# Patient Record
Sex: Female | Born: 1965 | Race: White | Hispanic: No | Marital: Single | Smoking: Current every day smoker
Health system: Southern US, Community
[De-identification: ages and names within clinical notes are randomized; demographics above are authoritative.]

## PROBLEM LIST (undated history)

## (undated) DIAGNOSIS — C801 Malignant (primary) neoplasm, unspecified: Secondary | ICD-10-CM

## (undated) DIAGNOSIS — F32A Depression, unspecified: Secondary | ICD-10-CM

## (undated) DIAGNOSIS — F329 Major depressive disorder, single episode, unspecified: Secondary | ICD-10-CM

## (undated) DIAGNOSIS — F419 Anxiety disorder, unspecified: Secondary | ICD-10-CM

## (undated) HISTORY — PX: TUBAL LIGATION: SHX77

## (undated) HISTORY — PX: CHOLECYSTECTOMY: SHX55

---

## 2007-10-14 DIAGNOSIS — C801 Malignant (primary) neoplasm, unspecified: Secondary | ICD-10-CM

## 2007-10-14 HISTORY — DX: Malignant (primary) neoplasm, unspecified: C80.1

## 2016-09-12 ENCOUNTER — Encounter (HOSPITAL_COMMUNITY): Payer: Self-pay | Admitting: *Deleted

## 2016-09-12 DIAGNOSIS — Z8541 Personal history of malignant neoplasm of cervix uteri: Secondary | ICD-10-CM | POA: Insufficient documentation

## 2016-09-12 DIAGNOSIS — F419 Anxiety disorder, unspecified: Secondary | ICD-10-CM | POA: Insufficient documentation

## 2016-09-12 DIAGNOSIS — Z760 Encounter for issue of repeat prescription: Secondary | ICD-10-CM | POA: Insufficient documentation

## 2016-09-12 DIAGNOSIS — F17210 Nicotine dependence, cigarettes, uncomplicated: Secondary | ICD-10-CM | POA: Insufficient documentation

## 2016-09-12 NOTE — ED Triage Notes (Signed)
Pt reports she has been having anxiety for 4 days, has hx of it and has not had her meds d/t recent move.  She has not been able to find a doctor.  Pt reports she has been feeling sick for 4 days and has no appetite since for 4 days.  She states she takes seroquel, clonazepam, and gabapentin.

## 2016-09-13 ENCOUNTER — Emergency Department (HOSPITAL_COMMUNITY)
Admission: EM | Admit: 2016-09-13 | Discharge: 2016-09-13 | Disposition: A | Discharge status: Home / Self Care | Payer: Self-pay | Attending: Emergency Medicine | Admitting: Emergency Medicine

## 2016-09-13 DIAGNOSIS — Z760 Encounter for issue of repeat prescription: Secondary | ICD-10-CM

## 2016-09-13 DIAGNOSIS — F410 Panic disorder [episodic paroxysmal anxiety] without agoraphobia: Secondary | ICD-10-CM

## 2016-09-13 HISTORY — DX: Malignant (primary) neoplasm, unspecified: C80.1

## 2016-09-13 HISTORY — DX: Anxiety disorder, unspecified: F41.9

## 2016-09-13 HISTORY — DX: Major depressive disorder, single episode, unspecified: F32.9

## 2016-09-13 HISTORY — DX: Depression, unspecified: F32.A

## 2016-09-13 MED ORDER — CLONAZEPAM 0.5 MG PO TABS
1.0000 mg | ORAL_TABLET | Freq: Once | ORAL | Status: AC
  Administered 2016-09-13: 1 mg via ORAL

## 2016-09-13 MED ORDER — GABAPENTIN 400 MG PO CAPS
800.0000 mg | ORAL_CAPSULE | Freq: Once | ORAL | Status: AC
  Administered 2016-09-13: 800 mg via ORAL

## 2016-09-13 MED ORDER — GABAPENTIN 800 MG PO TABS: 800.0000 mg | ORAL_TABLET | Freq: Three times a day (TID) | ORAL | 0 refills | Status: DC

## 2016-09-13 MED ORDER — QUETIAPINE FUMARATE 100 MG PO TABS
200.0000 mg | ORAL_TABLET | Freq: Every day | ORAL | Status: DC
  Administered 2016-09-13: 200 mg via ORAL

## 2016-09-13 MED ORDER — QUETIAPINE FUMARATE ER 400 MG PO TB24: 400.0000 mg | ORAL_TABLET | Freq: Every day | ORAL | 0 refills | Status: DC

## 2016-09-13 MED ORDER — CLONAZEPAM 1 MG PO TABS: 1.0000 mg | ORAL_TABLET | Freq: Two times a day (BID) | ORAL | 0 refills | Status: AC | PRN

## 2016-09-13 NOTE — Discharge Instructions (Signed)
Call and follow closely with local primary doctor for medication management. If you were given medicines take as directed.  If you are on coumadin or contraceptives realize their levels and effectiveness is altered by many different medicines.  If you have any reaction (rash, tongues swelling, other) to the medicines stop taking and see a physician.    If your blood pressure was elevated in the ER make sure you follow up for management with a primary doctor or return for chest pain, shortness of breath or stroke symptoms.  Please follow up as directed and return to the ER or see a physician for new or worsening symptoms.  Thank you. Vitals:   09/12/16 2240 09/13/16 0154 09/13/16 0200  BP: 173/93 146/69 127/67  Pulse: (!) 127 74 81  Resp: 20 16 11   Temp: 98 F (36.7 C)    TempSrc: Oral    SpO2: 99% 98% 97%  Weight: 142 lb 6.4 oz (64.6 kg)    Height: 5\' 4"  (1.626 m)

## 2016-09-13 NOTE — ED Provider Notes (Signed)
[place] DEPT Provider Note   CSN: [id number] Arrival date & time: 09/12/16  2216  By signing my name below, I, Gwenlyn Fudge, attest that this documentation has been prepared under the direction and in the presence of Elnora Morrison, MD. Electronically Signed: Gwenlyn Fudge, ED Scribe. 09/13/16. 2:51 AM.   History   Chief Complaint Chief Complaint  Patient presents with  . Anxiety   Pt recently moved to the area from [place] and ran out of her medications (Seroquel 400 mg ER at night , Gabapentin 800 mg 3x daily, and Clonazepam 1 mg 3x daily). Pt has been taking these medications for a significant time and thought she would be able to make it until her next appointment. She has been able to schedule an appointment, but ran out of her medications 3 days ago. She has associated decreased appetite, headache and chest pain. Denies seizures and urinary symptoms. She denies PMHx of Alcohol abuse. Pt is a current smoker.   The history is provided by the patient and a relative. No language interpreter was used.  Anxiety  This is a new problem. The current episode started more than 2 days ago. The problem occurs constantly. Associated symptoms include chest pain and headaches. Nothing aggravates the symptoms. The symptoms are relieved by medications. She has tried nothing for the symptoms.    Past Medical History:  Diagnosis Date  . Anxiety   . Cancer [facility]) 2009   cervical  . Depression     There are no active problems to display for this patient.   Past Surgical History:  Procedure Laterality Date  . CHOLECYSTECTOMY    . TUBAL LIGATION      OB History    No data available       Home Medications    Prior to Admission medications   Medication Sig Start Date End Date Taking? Authorizing Provider  clonazePAM (KLONOPIN) 1 MG tablet Take 1 mg by mouth 3 (three) times daily.   Yes Historical Provider, MD  gabapentin (NEURONTIN) 800 MG tablet Take 800 mg by mouth 3 (three) times  daily.   Yes Historical Provider, MD  QUEtiapine (SEROQUEL XR) 200 MG 24 hr tablet Take 200 mg by mouth at bedtime.   Yes Historical Provider, MD  clonazePAM (KLONOPIN) 1 MG tablet Take 1 tablet (1 mg total) by mouth 2 (two) times daily as needed for anxiety. 09/13/16   Elnora Morrison, MD  gabapentin (NEURONTIN) 800 MG tablet Take 1 tablet (800 mg total) by mouth 3 (three) times daily. 09/13/16   Elnora Morrison, MD  QUEtiapine (SEROQUEL XR) 400 MG 24 hr tablet Take 1 tablet (400 mg total) by mouth at bedtime. 09/13/16   Elnora Morrison, MD    Family History No family history on file.  Social History Social History  Substance Use Topics  . Smoking status: Current Every Day Smoker    Packs/day: 0.50    Types: Cigarettes  . Smokeless tobacco: Never Used  . Alcohol use No     Allergies   Patient has no known allergies.   Review of Systems Review of Systems  Cardiovascular: Positive for chest pain.  Genitourinary: Negative for decreased urine volume, difficulty urinating, dysuria, frequency and urgency.  Neurological: Positive for headaches.  All other systems reviewed and are negative.    Physical Exam Updated Vital Signs BP 127/67   Pulse 81   Temp 98 F (36.7 C) (Oral)   Resp 11   Ht 5\' 4"  (1.626 m)  Wt 142 lb 6.4 oz (64.6 kg)   LMP 08/29/2016   SpO2 97%   BMI 24.44 kg/m   Physical Exam  Constitutional: She is oriented to person, place, and time. She appears well-developed and well-nourished. She is active. No distress.  HENT:  Head: Normocephalic and atraumatic.  Eyes: Conjunctivae and EOM are normal. Pupils are equal, round, and reactive to light.  No nystagmus  Cardiovascular: Tachycardia present.   Pulmonary/Chest: Effort normal. No respiratory distress.  Abdominal: Soft. There is no tenderness.  Musculoskeletal: Normal range of motion.  Neurological: She is alert and oriented to person, place, and time.  Skin: Skin is warm and dry.  Psychiatric: Her behavior  is normal. Her mood appears anxious.  Nursing note and vitals reviewed.  ED Treatments / Results  DIAGNOSTIC STUDIES: Oxygen Saturation is 99% on RA, normal by my interpretation.    COORDINATION OF CARE: 1:40 AM Discussed treatment plan with pt at bedside which includes Clonazepam, Quetiapine, and Gabapentin and pt agreed to plan.  Labs (all labs ordered are listed, but only abnormal results are displayed) Labs Reviewed - No data to display  EKG  EKG Interpretation None     EKG reviewed heart rate 75, sinus, no acute ST changes, normal QT.  Radiology No results found.  Procedures Procedures (including critical care time)  Medications Ordered in ED Medications  QUEtiapine (SEROQUEL) tablet 200 mg (200 mg Oral Given 09/13/16 0212)  clonazePAM (KLONOPIN) tablet 1 mg (1 mg Oral Given 09/13/16 0212)  gabapentin (NEURONTIN) capsule 800 mg (800 mg Oral Given 09/13/16 0212)     Initial Impression / Assessment and Plan / ED Course  I have reviewed the triage vital signs and the nursing notes.  Pertinent labs & imaging results that were available during my care of the patient were reviewed by me and considered in my medical decision making (see chart for details).  Clinical Course    Patient presents with clinically anxiety and mild withdrawal symptoms from medications P patient improved in the ER with time and home meds given. Plan for prescription for a few days until she has her follow-up appointment this week. Patient has not had seizure activity no indication for admission right now. Heart rate normalized in the ER. Results and differential diagnosis were discussed with the patient/parent/guardian. Xrays were independently reviewed by myself.  Close follow up outpatient was discussed, comfortable with the plan.   Medications  QUEtiapine (SEROQUEL) tablet 200 mg (200 mg Oral Given 09/13/16 [id number])  clonazePAM (KLONOPIN) tablet 1 mg (1 mg Oral Given 09/13/16 [id number])  gabapentin  (NEURONTIN) capsule 800 mg (800 mg Oral Given 09/13/16 0212)    Vitals:   09/12/16 2240 09/13/16 0154 09/13/16 0200  BP: 173/93 146/69 127/67  Pulse: (!) 127 74 81  Resp: 20 16 11   Temp: 98 F (36.7 C)    TempSrc: Oral    SpO2: 99% 98% 97%  Weight: 142 lb 6.4 oz (64.6 kg)    Height: 5\' 4"  (1.626 m)      Final diagnoses:  Encounter for medication refill  Anxiety attack     Final Clinical Impressions(s) / ED Diagnoses   Final diagnoses:  Encounter for medication refill  Anxiety attack    New Prescriptions New Prescriptions   CLONAZEPAM (KLONOPIN) 1 MG TABLET    Take 1 tablet (1 mg total) by mouth 2 (two) times daily as needed for anxiety.   GABAPENTIN (NEURONTIN) 800 MG TABLET    Take 1 tablet (800 mg  total) by mouth 3 (three) times daily.   QUETIAPINE (SEROQUEL XR) 400 MG 24 HR TABLET    Take 1 tablet (400 mg total) by mouth at bedtime.     Elnora Morrison, MD 09/13/16 [phone]

## 2016-10-01 ENCOUNTER — Emergency Department (HOSPITAL_COMMUNITY): Payer: Self-pay

## 2016-10-01 ENCOUNTER — Emergency Department (HOSPITAL_COMMUNITY)
Admission: EM | Admit: 2016-10-01 | Discharge: 2016-10-02 | Disposition: A | Discharge status: Home / Self Care | Payer: Self-pay | Attending: Emergency Medicine | Admitting: Emergency Medicine

## 2016-10-01 ENCOUNTER — Encounter (HOSPITAL_COMMUNITY): Payer: Self-pay | Admitting: Emergency Medicine

## 2016-10-01 DIAGNOSIS — F13230 Sedative, hypnotic or anxiolytic dependence with withdrawal, uncomplicated: Secondary | ICD-10-CM | POA: Insufficient documentation

## 2016-10-01 DIAGNOSIS — Z8589 Personal history of malignant neoplasm of other organs and systems: Secondary | ICD-10-CM | POA: Insufficient documentation

## 2016-10-01 DIAGNOSIS — F13930 Sedative, hypnotic or anxiolytic use, unspecified with withdrawal, uncomplicated: Secondary | ICD-10-CM

## 2016-10-01 DIAGNOSIS — F17210 Nicotine dependence, cigarettes, uncomplicated: Secondary | ICD-10-CM | POA: Insufficient documentation

## 2016-10-01 DIAGNOSIS — Z79899 Other long term (current) drug therapy: Secondary | ICD-10-CM | POA: Insufficient documentation

## 2016-10-01 LAB — BASIC METABOLIC PANEL
ANION GAP: 11 (ref 5–15)
BUN: 8 mg/dL (ref 6–20)
CALCIUM: 9.5 mg/dL (ref 8.9–10.3)
CO2: 22 mmol/L (ref 22–32)
Chloride: 101 mmol/L (ref 101–111)
Creatinine, Ser: 0.75 mg/dL (ref 0.44–1.00)
Glucose, Bld: 191 mg/dL — ABNORMAL HIGH (ref 65–99)
Potassium: 3.2 mmol/L — ABNORMAL LOW (ref 3.5–5.1)
Sodium: 134 mmol/L — ABNORMAL LOW (ref 135–145)

## 2016-10-01 LAB — CBC
HCT: 45.4 % (ref 36.0–46.0)
HEMOGLOBIN: 15.8 g/dL — AB (ref 12.0–15.0)
MCH: 30.9 pg (ref 26.0–34.0)
MCHC: 34.8 g/dL (ref 30.0–36.0)
MCV: 88.7 fL (ref 78.0–100.0)
PLATELETS: 353 10*3/uL (ref 150–400)
RBC: 5.12 MIL/uL — AB (ref 3.87–5.11)
RDW: 13 % (ref 11.5–15.5)
WBC: 11.6 10*3/uL — ABNORMAL HIGH (ref 4.0–10.5)

## 2016-10-01 LAB — I-STAT BETA HCG BLOOD, ED (MC, WL, AP ONLY): I-stat hCG, quantitative: <5 m[IU]/mL (ref <5)

## 2016-10-01 LAB — I-STAT TROPONIN, ED: TROPONIN I, POC: 0 ng/mL (ref 0.00–0.08)

## 2016-10-01 MED ORDER — LORAZEPAM 2 MG/ML IJ SOLN
2.0000 mg | Freq: Once | INTRAMUSCULAR | Status: AC
  Administered 2016-10-01: 2 mg via INTRAVENOUS
  Filled 2016-10-01: qty 1

## 2016-10-01 MED ORDER — CHLORDIAZEPOXIDE HCL 25 MG PO CAPS
100.0000 mg | ORAL_CAPSULE | Freq: Once | ORAL | Status: AC
  Administered 2016-10-01: 100 mg via ORAL
  Filled 2016-10-01: qty 4

## 2016-10-01 MED ORDER — CHLORDIAZEPOXIDE HCL 25 MG PO CAPS: ORAL_CAPSULE | ORAL | 0 refills | Status: AC

## 2016-10-01 MED ORDER — SODIUM CHLORIDE 0.9 % IV BOLUS (SEPSIS)
2000.0000 mL | Freq: Once | INTRAVENOUS | Status: AC
  Administered 2016-10-01: 1000 mL via INTRAVENOUS

## 2016-10-01 NOTE — ED Provider Notes (Signed)
[place] DEPT Provider Note   CSN: [id number] Arrival date & time: 10/01/16  2138     History   Chief Complaint Chief Complaint  Patient presents with  . Anxiety  . Chest Pain    HPI Kylie Santos is a 50 y.o. female.  50 yo F with a chief complaint of anxiety. Going on for the past week or so. She has recently moved and run out of all of her home medications. She has felt very anxious as well as having some shortness of breath and chest pain. She denies history of PE or DVT. Denies lower extremity edema. Denies exogenous estrogen use. Denies hemoptysis or recent surgery.   The history is provided by the patient.  Anxiety  This is a recurrent problem. The current episode started more than 1 week ago. The problem occurs constantly. The problem has not changed since onset.Associated symptoms include chest pain and shortness of breath. Pertinent negatives include no headaches. Nothing aggravates the symptoms. Nothing relieves the symptoms. She has tried nothing for the symptoms. The treatment provided no relief.  Chest Pain   Associated symptoms include shortness of breath. Pertinent negatives include no dizziness, no fever, no headaches, no nausea, no palpitations and no vomiting.    Past Medical History:  Diagnosis Date  . Anxiety   . Cancer [facility]) 2009   cervical  . Depression     There are no active problems to display for this patient.   Past Surgical History:  Procedure Laterality Date  . CHOLECYSTECTOMY    . TUBAL LIGATION      OB History    No data available       Home Medications    Prior to Admission medications   Medication Sig Start Date End Date Taking? Authorizing Provider  clonazePAM (KLONOPIN) 1 MG tablet Take 1 mg by mouth 3 (three) times daily.   Yes Historical Provider, MD  gabapentin (NEURONTIN) 800 MG tablet Take 800 mg by mouth 3 (three) times daily.   Yes Historical Provider, MD  QUEtiapine (SEROQUEL XR) 200 MG 24 hr tablet Take 400  mg by mouth at bedtime.    Yes Historical Provider, MD  chlordiazePOXIDE (LIBRIUM) 25 MG capsule 50mg  PO TID x 1D, then 25-50mg  PO BID X 1D, then 25-50mg  PO QD X 1D 10/01/16   Deno Etienne, DO  clonazePAM (KLONOPIN) 1 MG tablet Take 1 tablet (1 mg total) by mouth 2 (two) times daily as needed for anxiety. Patient not taking: Reported on 10/01/2016 09/13/16   Elnora Morrison, MD  gabapentin (NEURONTIN) 800 MG tablet Take 1 tablet (800 mg total) by mouth 3 (three) times daily. Patient not taking: Reported on 10/01/2016 09/13/16   Elnora Morrison, MD  QUEtiapine (SEROQUEL XR) 400 MG 24 hr tablet Take 1 tablet (400 mg total) by mouth at bedtime. Patient not taking: Reported on 10/01/2016 09/13/16   Elnora Morrison, MD    Family History No family history on file.  Social History Social History  Substance Use Topics  . Smoking status: Current Every Day Smoker    Packs/day: 0.50    Types: Cigarettes  . Smokeless tobacco: Never Used  . Alcohol use No     Allergies   Patient has no known allergies.   Review of Systems Review of Systems  Constitutional: Negative for chills and fever.  HENT: Negative for congestion and rhinorrhea.   Eyes: Negative for redness and visual disturbance.  Respiratory: Positive for shortness of breath. Negative for wheezing.  Cardiovascular: Positive for chest pain. Negative for palpitations.  Gastrointestinal: Negative for nausea and vomiting.  Genitourinary: Negative for dysuria and urgency.  Musculoskeletal: Negative for arthralgias and myalgias.  Skin: Negative for pallor and wound.  Neurological: Negative for dizziness and headaches.     Physical Exam Updated Vital Signs BP 145/85   Pulse (!) 124   Resp 15   LMP 08/29/2016   SpO2 98%   Physical Exam  Constitutional: She is oriented to person, place, and time. She appears well-developed and well-nourished. No distress.  HENT:  Head: Normocephalic and atraumatic.  Eyes: EOM are normal. Pupils are equal,  round, and reactive to light.  Neck: Normal range of motion. Neck supple.  Cardiovascular: Regular rhythm.  Tachycardia present.  Exam reveals no gallop and no friction rub.   No murmur heard. Pulmonary/Chest: Effort normal. She has no wheezes. She has no rales.  Abdominal: Soft. She exhibits no distension. There is no tenderness.  Musculoskeletal: She exhibits no edema or tenderness.  Neurological: She is alert and oriented to person, place, and time.  Skin: Skin is warm and dry. She is not diaphoretic.  Psychiatric: She has a normal mood and affect. She is agitated.  Nursing note and vitals reviewed.    ED Treatments / Results  Labs (all labs ordered are listed, but only abnormal results are displayed) Labs Reviewed  BASIC METABOLIC PANEL - Abnormal; Notable for the following:       Result Value   Sodium 134 (*)    Potassium 3.2 (*)    Glucose, Bld 191 (*)    All other components within normal limits  CBC - Abnormal; Notable for the following:    WBC 11.6 (*)    RBC 5.12 (*)    Hemoglobin 15.8 (*)    All other components within normal limits  I-STAT TROPOININ, ED  I-STAT BETA HCG BLOOD, ED ([facility], [facility], [facility] ONLY)    EKG  EKG Interpretation  Date/Time:  Wednesday October 01 2016 22:01:03 EST Ventricular Rate:  146 PR Interval:    QRS Duration: 82 QT Interval:  277 QTC Calculation: 432 R Axis:   72 Text Interpretation:  Sinus tachycardia LAE, consider biatrial enlargement No old tracing to compare Confirmed by Lakevia Perris MD, DANIEL [phone]) on 10/01/2016 11:19:50 PM       Radiology Dg Chest 2 View  Result Date: 10/01/2016 CLINICAL DATA:  Sudden onset of upper midsternal chest pain this evening. Dyspnea. EXAM: CHEST  2 VIEW COMPARISON:  None. FINDINGS: The heart size and mediastinal contours are within normal limits. Both lungs are clear except for minor calcified granulomatous changes. The visualized skeletal structures are unremarkable. IMPRESSION: No acute cardiopulmonary  disease. Electronically Signed   By: Andreas Newport M.D.   On: 10/01/2016 22:43    Procedures Procedures (including critical care time)  Medications Ordered in ED Medications  LORazepam (ATIVAN) injection 2 mg (2 mg Intravenous Given 10/01/16 2254)  chlordiazePOXIDE (LIBRIUM) capsule 100 mg (100 mg Oral Given 10/01/16 2255)  sodium chloride 0.9 % bolus 2,000 mL (1,000 mLs Intravenous New Bag/Given 10/01/16 2254)     Initial Impression / Assessment and Plan / ED Course  I have reviewed the triage vital signs and the nursing notes.  Pertinent labs & imaging results that were available during my care of the patient were reviewed by me and considered in my medical decision making (see chart for details).  Clinical Course     50 yo F With a chief complaint of  likely benzodiazepine withdrawal. Patient however has a heart rate into the 160s. She was given Ativan and Librium with resolution of her tachycardia. Basic laboratory evaluation unremarkable. Feel this is most likely related to her withdrawal and not a PE. Wells score of 0. Discharge home.  11:49 PM:  I have discussed the diagnosis/risks/treatment options with the patient and believe the pt to be eligible for discharge home to follow-up with PCP. We also discussed returning to the ED immediately if new or worsening sx occur. We discussed the sx which are most concerning (e.g., sudden worsening pain, fever, inability to tolerate by mouth) that necessitate immediate return. Medications administered to the patient during their visit and any new prescriptions provided to the patient are listed below.  Medications given during this visit Medications  LORazepam (ATIVAN) injection 2 mg (2 mg Intravenous Given 10/01/16 2254)  chlordiazePOXIDE (LIBRIUM) capsule 100 mg (100 mg Oral Given 10/01/16 2255)  sodium chloride 0.9 % bolus 2,000 mL (1,000 mLs Intravenous New Bag/Given 10/01/16 2254)     The patient appears reasonably screen and/or  stabilized for discharge and I doubt any other medical condition or other [facility] requiring further screening, evaluation, or treatment in the ED at this time prior to discharge.    Final Clinical Impressions(s) / ED Diagnoses   Final diagnoses:  Benzodiazepine withdrawal without complication (HCC)    New Prescriptions New Prescriptions   CHLORDIAZEPOXIDE (LIBRIUM) 25 MG CAPSULE    50mg  PO TID x 1D, then 25-50mg  PO BID X 1D, then 25-50mg  PO QD X 1D     Deno Etienne, DO 10/01/16 2349

## 2016-10-01 NOTE — ED Notes (Signed)
Pt states that she has severe anxiety attacks and she's been out of her medications for about 9 days

## 2016-10-01 NOTE — ED Triage Notes (Signed)
Pt stating feeling anxious and it is causing some chest discomfort. Pt states she has not had her anxiety meds in "a while." pain 7/10

## 2016-10-01 NOTE — Discharge Instructions (Signed)
Follow up with your family doc or psychiatrist.

## 2016-10-02 NOTE — ED Notes (Signed)
Pt's son in law is coming to get her

## 2016-10-06 ENCOUNTER — Emergency Department (HOSPITAL_BASED_OUTPATIENT_CLINIC_OR_DEPARTMENT_OTHER): Payer: Self-pay

## 2016-10-06 ENCOUNTER — Encounter (HOSPITAL_BASED_OUTPATIENT_CLINIC_OR_DEPARTMENT_OTHER): Payer: Self-pay

## 2016-10-06 ENCOUNTER — Emergency Department (HOSPITAL_BASED_OUTPATIENT_CLINIC_OR_DEPARTMENT_OTHER)
Admission: EM | Admit: 2016-10-06 | Discharge: 2016-10-06 | Disposition: A | Discharge status: Home / Self Care | Payer: Self-pay | Attending: Emergency Medicine | Admitting: Emergency Medicine

## 2016-10-06 DIAGNOSIS — F419 Anxiety disorder, unspecified: Secondary | ICD-10-CM | POA: Insufficient documentation

## 2016-10-06 DIAGNOSIS — Z79899 Other long term (current) drug therapy: Secondary | ICD-10-CM | POA: Insufficient documentation

## 2016-10-06 DIAGNOSIS — F17210 Nicotine dependence, cigarettes, uncomplicated: Secondary | ICD-10-CM | POA: Insufficient documentation

## 2016-10-06 DIAGNOSIS — R079 Chest pain, unspecified: Secondary | ICD-10-CM

## 2016-10-06 DIAGNOSIS — Z8541 Personal history of malignant neoplasm of cervix uteri: Secondary | ICD-10-CM | POA: Insufficient documentation

## 2016-10-06 LAB — TROPONIN I

## 2016-10-06 MED ORDER — GABAPENTIN 800 MG PO TABS: 800.0000 mg | ORAL_TABLET | Freq: Three times a day (TID) | ORAL | 0 refills | Status: AC

## 2016-10-06 MED ORDER — LORAZEPAM 1 MG PO TABS
2.0000 mg | ORAL_TABLET | Freq: Once | ORAL | Status: AC
  Administered 2016-10-06: 2 mg via ORAL
  Filled 2016-10-06: qty 2

## 2016-10-06 MED ORDER — GABAPENTIN 100 MG PO CAPS
ORAL_CAPSULE | ORAL | Status: AC
  Administered 2016-10-06: 200 mg
  Filled 2016-10-06: qty 2

## 2016-10-06 MED ORDER — GABAPENTIN 600 MG PO TABS
ORAL_TABLET | ORAL | Status: AC
  Administered 2016-10-06: 600 mg via ORAL
  Filled 2016-10-06: qty 1

## 2016-10-06 MED ORDER — QUETIAPINE FUMARATE ER 400 MG PO TB24: 400.0000 mg | ORAL_TABLET | Freq: Every day | ORAL | 0 refills | Status: AC

## 2016-10-06 MED ORDER — GABAPENTIN 800 MG PO TABS
800.0000 mg | ORAL_TABLET | Freq: Once | ORAL | Status: AC
  Administered 2016-10-06: 600 mg via ORAL
  Filled 2016-10-06: qty 1

## 2016-10-06 NOTE — ED Triage Notes (Signed)
Returns for anxiety due to being out of home medication klonopin.  Was seen at [facility] for same symptoms and prescribed librium with no relief.  Also reports out of gabapentin and seroquel

## 2016-10-06 NOTE — ED Provider Notes (Signed)
[place] DEPT [facility] Provider Note   CSN: [id number] Arrival date & time: 10/06/16  0240     History   Chief Complaint Chief Complaint  Patient presents with  . Anxiety    HPI Kylie Santos is a 50 y.o. female.  HPI  This is a 50 year old female with a history of anxiety and depression who presents with anxiety. Patient reports that she feels like she is having a panic attack. She has been out of her home medications for approximately 8 days. She just recently moved here and does not number primary physician. Has previously been on Klonopin, Seroquel, and gabapentin. She was seen in early December and given a short course of all of these medications. She was subsequently seen on December 20. That time she's given a Librium taper. She still does not have a primary physician. In addition to anxiety she reports palpitations and chest tightness. Denies shortness of breath, leg swelling, history of blood clots. No history of heart disease, diabetes, hypertension, hyperlipidemia.  Past Medical History:  Diagnosis Date  . Anxiety   . Cancer [facility]) 2009   cervical  . Depression     There are no active problems to display for this patient.   Past Surgical History:  Procedure Laterality Date  . CHOLECYSTECTOMY    . TUBAL LIGATION      OB History    No data available       Home Medications    Prior to Admission medications   Medication Sig Start Date End Date Taking? Authorizing Provider  chlordiazePOXIDE (LIBRIUM) 25 MG capsule 50mg  PO TID x 1D, then 25-50mg  PO BID X 1D, then 25-50mg  PO QD X 1D 10/01/16   Deno Etienne, DO  clonazePAM (KLONOPIN) 1 MG tablet Take 1 mg by mouth 3 (three) times daily.    Historical Provider, MD  clonazePAM (KLONOPIN) 1 MG tablet Take 1 tablet (1 mg total) by mouth 2 (two) times daily as needed for anxiety. Patient not taking: Reported on 10/01/2016 09/13/16   Elnora Morrison, MD  gabapentin (NEURONTIN) 800 MG tablet Take 1 tablet (800 mg total)  by mouth 3 (three) times daily. 10/06/16   Merryl Hacker, MD  QUEtiapine (SEROQUEL XR) 400 MG 24 hr tablet Take 1 tablet (400 mg total) by mouth at bedtime. 10/06/16   Merryl Hacker, MD    Family History No family history on file.  Social History Social History  Substance Use Topics  . Smoking status: Current Every Day Smoker    Packs/day: 0.50    Types: Cigarettes  . Smokeless tobacco: Never Used  . Alcohol use No     Allergies   Patient has no known allergies.   Review of Systems Review of Systems  Constitutional: Negative for fever.  Respiratory: Negative for shortness of breath.   Cardiovascular: Positive for chest pain. Negative for leg swelling.  Psychiatric/Behavioral: Negative for suicidal ideas. The patient is nervous/anxious.   All other systems reviewed and are negative.    Physical Exam Updated Vital Signs BP 167/98 (BP Location: Left Arm)   Pulse (!) 122   Temp 97.6 F (36.4 C) (Oral)   Resp 22   Ht 5\' 4"  (1.626 m)   Wt 140 lb (63.5 kg)   LMP 09/28/2016   SpO2 99%   BMI 24.03 kg/m   Physical Exam  Constitutional: She is oriented to person, place, and time. No distress.  Anxious appearing, nontoxic, no acute distress  HENT:  Head: Normocephalic and atraumatic.  Cardiovascular: Regular rhythm and normal heart sounds.   Tachycardia  Pulmonary/Chest: Effort normal and breath sounds normal. No respiratory distress. She has no wheezes. She exhibits no tenderness.  Abdominal: Soft. Bowel sounds are normal. There is no tenderness.  Neurological: She is alert and oriented to person, place, and time.  Skin: Skin is warm and dry.  Psychiatric: She has a normal mood and affect.  Nursing note and vitals reviewed.    ED Treatments / Results  Labs (all labs ordered are listed, but only abnormal results are displayed) Labs Reviewed  TROPONIN I    EKG  EKG Interpretation  Date/Time:  Monday October 06 2016 03:07:10 EST Ventricular Rate:    95 PR Interval:    QRS Duration: 92 QT Interval:  340 QTC Calculation: 428 R Axis:   64 Text Interpretation:  Sinus rhythm Confirmed by Dina Rich  MD, [place] ([id number]) on 10/06/2016 4:01:09 AM       Radiology Dg Chest 2 View  Result Date: 10/06/2016 CLINICAL DATA:  Anxiety and chest palpitations EXAM: CHEST  2 VIEW COMPARISON:  Chest radiograph 10/01/2016 FINDINGS: Normal cardiomediastinal contours. No focal airspace consolidation or pulmonary edema. Calcified granuloma in the mid right lung is unchanged. There are nodular opacities near the left lung base, also unchanged. No pulmonary edema. No pleural effusion or pneumothorax. IMPRESSION: 1. No acute cardiopulmonary disease. 2. Unchanged left basilar nodules may be calcified granulomata, sequela of prior infection. Nonemergent CT of the chest should be considered for further characterization. Electronically Signed   By: Ulyses Jarred M.D.   On: 10/06/2016 03:54    Procedures Procedures (including critical care time)  Medications Ordered in ED Medications  LORazepam (ATIVAN) tablet 2 mg (2 mg Oral Given 10/06/16 0339)  gabapentin (NEURONTIN) tablet 800 mg (600 mg Oral Given 10/06/16 0338)  gabapentin (NEURONTIN) 100 MG capsule (200 mg  Given 10/06/16 [id number])     Initial Impression / Assessment and Plan / ED Course  I have reviewed the triage vital signs and the nursing notes.  Pertinent labs & imaging results that were available during my care of the patient were reviewed by me and considered in my medical decision making (see chart for details).  Clinical Course     Patient presents with anxiety. Also reports chest tightness. Similar presentations recently for the same. Reports Librium is not helping. Tachycardic on exam but otherwise exam is benign. Patient given Ativan and her home gabapentin. EKG is nonischemic. Chest x-ray is reassuring. Troponin is negative. While patient is tachycardic, no other risk factors for PE. Feel  tachycardia is likely related to anxiety. On repeat exam, patient feels much better. Heart rate is now in the 80s.  I discussed with the patient that I would not be providing her with Klonopin prescription. I will provide her with a short course of Seroquel and gabapentin. She will also be given [facility] for follow-up.  After history, exam, and medical workup I feel the patient has been appropriately medically screened and is safe for discharge home. Pertinent diagnoses were discussed with the patient. Patient was given return precautions.   Final Clinical Impressions(s) / ED Diagnoses   Final diagnoses:  Anxiety  Chest pain, unspecified type    New Prescriptions Current Discharge Medication List       Merryl Hacker, MD 10/06/16 [phone]

## 2016-10-06 NOTE — ED Notes (Signed)
Returned from xray

## 2016-10-06 NOTE — ED Notes (Signed)
MD with pt  

## 2016-10-06 NOTE — ED Notes (Signed)
Pt transported to xray 

## 2017-10-07 IMAGING — CR DG CHEST 2V
2 series · 2 of 2 positions shown · non-contrast
Comparison: None.

CLINICAL DATA: Sudden onset of upper midsternal chest pain this
evening. Dyspnea.

EXAM:
CHEST  2 VIEW

[w chest lat]
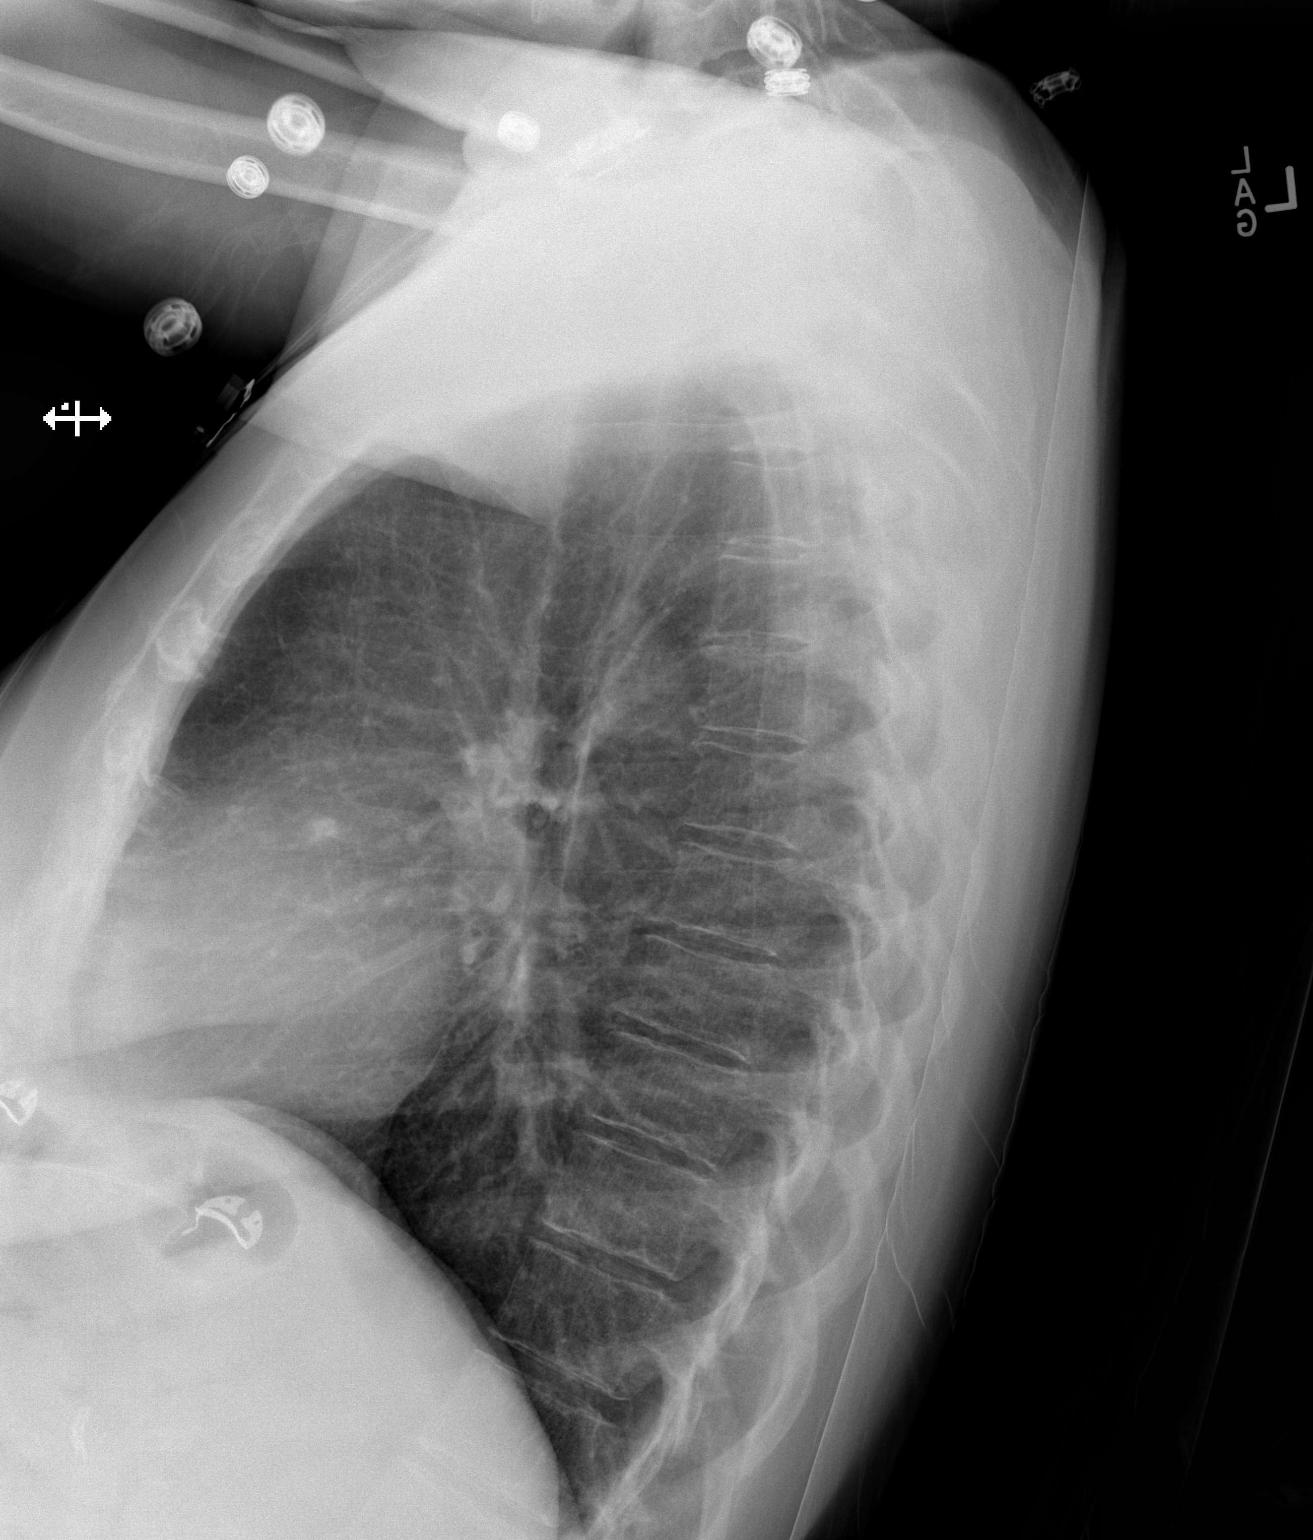

[x chest ap]
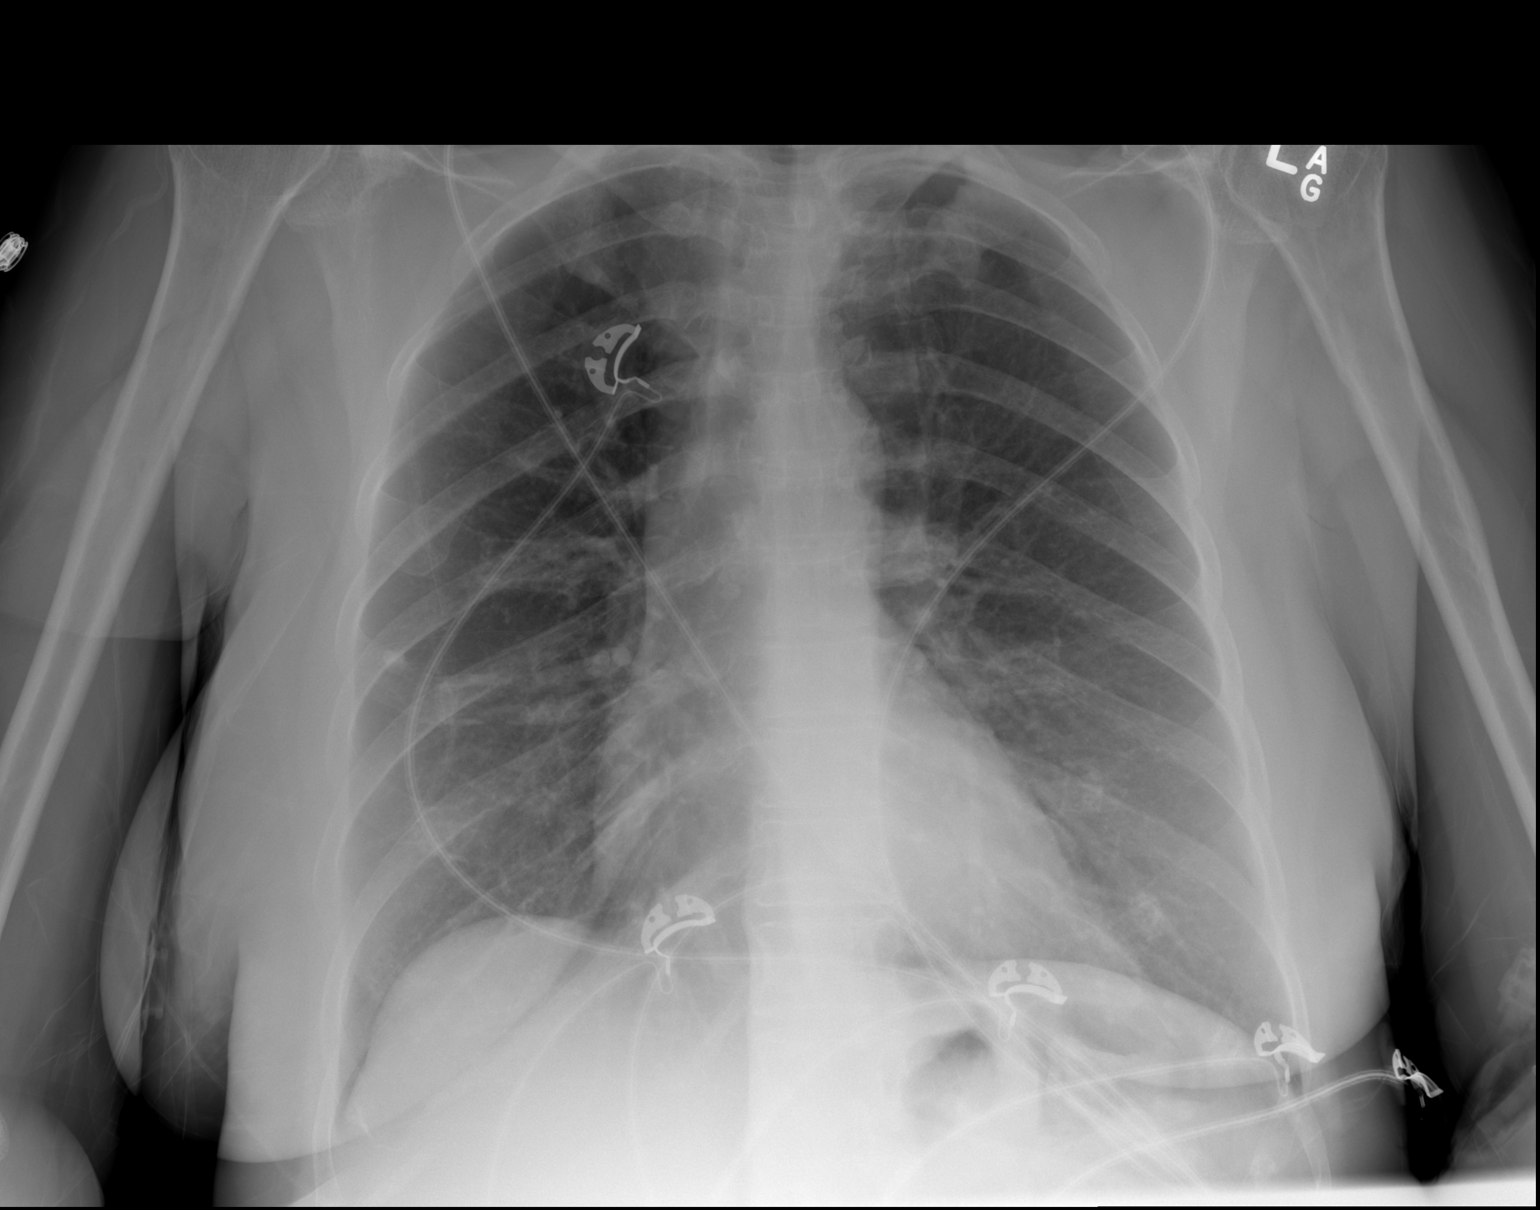

[2 of 2 positions shown; findings below may reference images not displayed]

FINDINGS: The heart size and mediastinal contours are within normal limits.
Both lungs are clear except for minor calcified granulomatous
changes. The visualized skeletal structures are unremarkable.
IMPRESSION: No acute cardiopulmonary disease.
